# Patient Record
Sex: Female | Born: 1955 | Race: White | Hispanic: No | Marital: Married | Smoking: Never smoker
Health system: Southern US, Community
[De-identification: ages and names within clinical notes are randomized; demographics above are authoritative.]

## PROBLEM LIST (undated history)

## (undated) DIAGNOSIS — K219 Gastro-esophageal reflux disease without esophagitis: Secondary | ICD-10-CM

---

## 1997-12-29 ENCOUNTER — Emergency Department (HOSPITAL_COMMUNITY): Admission: EM | Admit: 1997-12-29 | Discharge: 1997-12-29 | Payer: Self-pay | Admitting: Emergency Medicine

## 1999-10-27 ENCOUNTER — Other Ambulatory Visit: Admission: RE | Admit: 1999-10-27 | Discharge: 1999-10-27 | Payer: Self-pay | Admitting: Gynecology

## 1999-10-29 ENCOUNTER — Encounter: Payer: Self-pay | Admitting: Gynecology

## 1999-10-29 ENCOUNTER — Encounter: Admission: RE | Admit: 1999-10-29 | Discharge: 1999-10-29 | Payer: Self-pay | Admitting: Gynecology

## 2000-11-11 ENCOUNTER — Other Ambulatory Visit: Admission: RE | Admit: 2000-11-11 | Discharge: 2000-11-11 | Payer: Self-pay | Admitting: Gynecology

## 2000-11-16 ENCOUNTER — Encounter: Admission: RE | Admit: 2000-11-16 | Discharge: 2000-11-16 | Payer: Self-pay | Admitting: Gynecology

## 2000-11-16 ENCOUNTER — Encounter: Payer: Self-pay | Admitting: Gynecology

## 2002-01-02 ENCOUNTER — Other Ambulatory Visit: Admission: RE | Admit: 2002-01-02 | Discharge: 2002-01-02 | Payer: Self-pay | Admitting: Gynecology

## 2002-01-09 ENCOUNTER — Encounter: Admission: RE | Admit: 2002-01-09 | Discharge: 2002-01-09 | Payer: Self-pay | Admitting: Gynecology

## 2002-01-09 ENCOUNTER — Encounter: Payer: Self-pay | Admitting: Gynecology

## 2002-01-13 ENCOUNTER — Encounter: Admission: RE | Admit: 2002-01-13 | Discharge: 2002-01-13 | Payer: Self-pay | Admitting: Gynecology

## 2002-01-13 ENCOUNTER — Encounter: Payer: Self-pay | Admitting: Gynecology

## 2003-01-24 ENCOUNTER — Encounter: Admission: RE | Admit: 2003-01-24 | Discharge: 2003-01-24 | Payer: Self-pay | Admitting: Gynecology

## 2003-01-24 ENCOUNTER — Encounter: Payer: Self-pay | Admitting: Gynecology

## 2003-01-31 ENCOUNTER — Encounter: Admission: RE | Admit: 2003-01-31 | Discharge: 2003-01-31 | Payer: Self-pay | Admitting: Gynecology

## 2003-01-31 ENCOUNTER — Encounter: Payer: Self-pay | Admitting: Gynecology

## 2003-04-23 ENCOUNTER — Encounter: Admission: RE | Admit: 2003-04-23 | Discharge: 2003-04-23 | Payer: Self-pay | Admitting: Gynecology

## 2003-05-21 ENCOUNTER — Encounter (INDEPENDENT_AMBULATORY_CARE_PROVIDER_SITE_OTHER): Payer: Self-pay | Admitting: *Deleted

## 2003-05-21 ENCOUNTER — Ambulatory Visit (HOSPITAL_BASED_OUTPATIENT_CLINIC_OR_DEPARTMENT_OTHER): Admission: RE | Admit: 2003-05-21 | Discharge: 2003-05-21 | Payer: Self-pay | Admitting: Surgery

## 2003-05-21 ENCOUNTER — Ambulatory Visit (HOSPITAL_COMMUNITY): Admission: RE | Admit: 2003-05-21 | Discharge: 2003-05-21 | Payer: Self-pay | Admitting: Surgery

## 2003-07-03 ENCOUNTER — Other Ambulatory Visit: Admission: RE | Admit: 2003-07-03 | Discharge: 2003-07-03 | Payer: Self-pay | Admitting: Gynecology

## 2004-07-17 ENCOUNTER — Other Ambulatory Visit: Admission: RE | Admit: 2004-07-17 | Discharge: 2004-07-17 | Payer: Self-pay | Admitting: Gynecology

## 2004-12-30 ENCOUNTER — Other Ambulatory Visit: Admission: RE | Admit: 2004-12-30 | Discharge: 2004-12-30 | Payer: Self-pay | Admitting: Gynecology

## 2005-07-21 ENCOUNTER — Ambulatory Visit (HOSPITAL_COMMUNITY): Admission: RE | Admit: 2005-07-21 | Discharge: 2005-07-21 | Payer: Self-pay | Admitting: Gynecology

## 2005-07-29 ENCOUNTER — Other Ambulatory Visit: Admission: RE | Admit: 2005-07-29 | Discharge: 2005-07-29 | Payer: Self-pay | Admitting: Gynecology

## 2005-08-13 ENCOUNTER — Encounter: Admission: RE | Admit: 2005-08-13 | Discharge: 2005-08-13 | Payer: Self-pay | Admitting: Gynecology

## 2006-05-20 ENCOUNTER — Other Ambulatory Visit: Admission: RE | Admit: 2006-05-20 | Discharge: 2006-05-20 | Payer: Self-pay | Admitting: Gynecology

## 2006-08-27 ENCOUNTER — Encounter: Admission: RE | Admit: 2006-08-27 | Discharge: 2006-08-27 | Payer: Self-pay | Admitting: Gynecology

## 2006-09-01 ENCOUNTER — Encounter: Admission: RE | Admit: 2006-09-01 | Discharge: 2006-09-01 | Payer: Self-pay | Admitting: Gynecology

## 2007-06-29 ENCOUNTER — Encounter: Admission: RE | Admit: 2007-06-29 | Discharge: 2007-06-29 | Payer: Self-pay | Admitting: Gynecology

## 2007-09-06 ENCOUNTER — Encounter: Admission: RE | Admit: 2007-09-06 | Discharge: 2007-09-06 | Payer: Self-pay | Admitting: Gynecology

## 2008-09-06 ENCOUNTER — Encounter: Admission: RE | Admit: 2008-09-06 | Discharge: 2008-09-06 | Payer: Self-pay | Admitting: Gynecology

## 2009-09-10 ENCOUNTER — Encounter: Admission: RE | Admit: 2009-09-10 | Discharge: 2009-09-10 | Payer: Self-pay | Admitting: Gynecology

## 2010-05-11 ENCOUNTER — Encounter: Payer: Self-pay | Admitting: Gynecology

## 2010-05-26 ENCOUNTER — Other Ambulatory Visit: Payer: Self-pay | Admitting: Gynecology

## 2010-09-05 NOTE — Op Note (Signed)
NAMEJALIZA, Jamie Graves                          ACCOUNT NO.:  [id number]   MEDICAL RECORD NO.:  [id number]                   PATIENT TYPE:  AMB   LOCATION:  DSC                                  FACILITY:  [facility]   PHYSICIAN:  Currie Paris, M.D.           DATE OF BIRTH:  [birth year]   DATE OF PROCEDURE:  05/21/2003  DATE OF DISCHARGE:                                 OPERATIVE REPORT   PREOPERATIVE DIAGNOSIS:  Left breast mass.   POSTOPERATIVE:  Left breast mass.   OPERATION:  Excisional biopsy of left breast mass.   SURGEON:  Currie Paris, M.D.   ANESTHESIA:  MAC.   CLINICAL HISTORY:  This patient has presented with a small area of density,  irregularity, and firmness in the upper inner quadrant of the left breast.  It was about 1 x 3 cm and was not seen on the mammography and ultrasound.  However, it was clearly different to palpation than any other area, and we  elected to do a biopsy.   PROCEDURE:  The patient was seen in the holding area and had already marked  the left side as the operative side, and I confirmed that with a mark.  She  was taken to the operating room and prior to be given IV sedation, the mass  itself was palpated, identified, and marked.  She was then given IV  sedation.  The breast was prepped and draped.  1% Xylocaine was used for  local.  I made an incision directly over the area, divided a little  subcutaneous tissue, and identified breast tissue and put a suture through  it for traction.  I then excised this area with cautery, and it appeared to  be basically fibrotic breast tissue with a little fat tissue mixed in.   Once this was excised, to make sure everything was dry, I used the cautery.  The incision was closed with 3-0 Vicryl, followed by 4-0 Monocryl  subcuticular and Dermabond.  The patient tolerated the procedure well.  There were no operative complications.  All counts were correct.       Currie Paris, M.D.    CJS/MEDQ  D:  05/21/2003  T:  05/21/2003  Job:  [id number]   cc:   Gretta Cool, M.D.  [address] [place]  [place] [address]  Fax: [phone]

## 2010-09-26 ENCOUNTER — Other Ambulatory Visit: Payer: Self-pay | Admitting: Gynecology

## 2010-09-26 DIAGNOSIS — Z1231 Encounter for screening mammogram for malignant neoplasm of breast: Secondary | ICD-10-CM

## 2010-10-03 ENCOUNTER — Ambulatory Visit: Payer: Self-pay

## 2010-10-20 ENCOUNTER — Ambulatory Visit
Admission: RE | Admit: 2010-10-20 | Discharge: 2010-10-20 | Disposition: A | Discharge status: Home / Self Care | Payer: BC Managed Care – PPO | Source: Ambulatory Visit | Attending: Gynecology | Admitting: Gynecology

## 2010-10-20 DIAGNOSIS — Z1231 Encounter for screening mammogram for malignant neoplasm of breast: Secondary | ICD-10-CM

## 2011-05-12 ENCOUNTER — Other Ambulatory Visit: Payer: Self-pay | Admitting: Gynecology

## 2012-08-29 ENCOUNTER — Other Ambulatory Visit: Payer: Self-pay

## 2012-08-29 DIAGNOSIS — Z1231 Encounter for screening mammogram for malignant neoplasm of breast: Secondary | ICD-10-CM

## 2012-09-28 ENCOUNTER — Ambulatory Visit
Admission: RE | Admit: 2012-09-28 | Discharge: 2012-09-28 | Disposition: A | Discharge status: Home / Self Care | Payer: BC Managed Care – PPO | Source: Ambulatory Visit

## 2012-09-28 DIAGNOSIS — Z1231 Encounter for screening mammogram for malignant neoplasm of breast: Secondary | ICD-10-CM

## 2012-12-08 ENCOUNTER — Emergency Department (HOSPITAL_BASED_OUTPATIENT_CLINIC_OR_DEPARTMENT_OTHER)
Admission: EM | Admit: 2012-12-08 | Discharge: 2012-12-08 | Disposition: A | Discharge status: Home / Self Care | Payer: BC Managed Care – PPO | Attending: Emergency Medicine | Admitting: Emergency Medicine

## 2012-12-08 ENCOUNTER — Emergency Department (HOSPITAL_BASED_OUTPATIENT_CLINIC_OR_DEPARTMENT_OTHER): Payer: BC Managed Care – PPO

## 2012-12-08 ENCOUNTER — Encounter (HOSPITAL_BASED_OUTPATIENT_CLINIC_OR_DEPARTMENT_OTHER): Payer: Self-pay | Admitting: *Deleted

## 2012-12-08 DIAGNOSIS — K219 Gastro-esophageal reflux disease without esophagitis: Secondary | ICD-10-CM | POA: Insufficient documentation

## 2012-12-08 DIAGNOSIS — Z79899 Other long term (current) drug therapy: Secondary | ICD-10-CM | POA: Insufficient documentation

## 2012-12-08 DIAGNOSIS — R109 Unspecified abdominal pain: Secondary | ICD-10-CM | POA: Insufficient documentation

## 2012-12-08 DIAGNOSIS — R110 Nausea: Secondary | ICD-10-CM | POA: Insufficient documentation

## 2012-12-08 DIAGNOSIS — R0789 Other chest pain: Secondary | ICD-10-CM | POA: Insufficient documentation

## 2012-12-08 HISTORY — DX: Gastro-esophageal reflux disease without esophagitis: K21.9

## 2012-12-08 LAB — CBC WITH DIFFERENTIAL/PLATELET
Eosinophils Absolute: 0.1 10*3/uL (ref 0.0–0.7)
Eosinophils Relative: 3 % (ref 0–5)
HCT: 37.9 % (ref 36.0–46.0)
Lymphs Abs: 1.8 10*3/uL (ref 0.7–4.0)
MCHC: 33 g/dL (ref 30.0–36.0)
MCV: 97.4 fL (ref 78.0–100.0)
Neutro Abs: 2.3 10*3/uL (ref 1.7–7.7)
Neutrophils Relative %: 49 % (ref 43–77)
Platelets: 229 10*3/uL (ref 150–400)
RBC: 3.89 MIL/uL (ref 3.87–5.11)

## 2012-12-08 LAB — TROPONIN I
Troponin I: <0.3 ng/mL (ref <0.30)
Troponin I: <0.3 ng/mL (ref <0.30)

## 2012-12-08 LAB — COMPREHENSIVE METABOLIC PANEL
Calcium: 9.9 mg/dL (ref 8.4–10.5)
Chloride: 103 mEq/L (ref 96–112)
GFR calc non Af Amer: >90 mL/min (ref >90)

## 2012-12-08 MED ORDER — GI COCKTAIL ~~LOC~~
30.0000 mL | Freq: Once | ORAL | Status: AC
  Administered 2012-12-08: 30 mL via ORAL
  Filled 2012-12-08: qty 30

## 2012-12-08 MED ORDER — PANTOPRAZOLE SODIUM 40 MG IV SOLR
40.0000 mg | Freq: Once | INTRAVENOUS | Status: AC
  Administered 2012-12-08: 40 mg via INTRAVENOUS
  Filled 2012-12-08: qty 40

## 2012-12-08 MED ORDER — OMEPRAZOLE 20 MG PO CPDR: 20.0000 mg | DELAYED_RELEASE_CAPSULE | Freq: Every day | ORAL | Status: DC

## 2012-12-08 NOTE — ED Provider Notes (Signed)
CSN: [id number]     Arrival date & time 12/08/12  7829 History     First MD Initiated Contact with Patient 12/08/12 1005     Chief Complaint  Patient presents with  . Chest Pain   (Consider location/radiation/quality/duration/timing/severity/associated sxs/prior Treatment) HPI Comments: Patient presents with 3 day history of intermittent epigastric pain it radiates to her back. The pain comes and goes as a burning, sharp and stabbing pain lasting for 1 minute at a time. This comes and goes every few minutes to hours. It is somewhat worse with food and somewhat better with laying down. Is associated with some shortness of breath and nausea. No diaphoresis. Cardiac history. Never had a stress test. Has a history of acid reflux in the used to be on Nexium. Has not been on meds for 5 years as it seemed to go when she changed her diet. This pain is more severe than what she had in the past. She continues to drink 2 glasses of wine daily. She does not smoke. She's never had an upper endoscopy or stress test.  The history is provided by the patient.    Past Medical History  Diagnosis Date  . GERD (gastroesophageal reflux disease)     no treatment in 5 years.   History reviewed. No pertinent past surgical history. No family history on file. History  Substance Use Topics  . Smoking status: Never Smoker   . Smokeless tobacco: Never Used  . Alcohol Use: 1.2 oz/week    2 Glasses of wine per week     Comment: daily   OB History   Grav Para Term Preterm Abortions TAB SAB Ect Mult Living                 Review of Systems  Constitutional: Negative for fever, activity change and appetite change.  HENT: Negative for rhinorrhea.   Respiratory: Negative for cough, chest tightness and shortness of breath.   Cardiovascular: Positive for chest pain.  Gastrointestinal: Positive for nausea and abdominal pain. Negative for vomiting.  Genitourinary: Negative for dysuria, vaginal bleeding and vaginal  discharge.  Musculoskeletal: Negative for back pain.  Skin: Negative for rash.  Neurological: Negative for dizziness, weakness and headaches.  A complete 10 system review of systems was obtained and all systems are negative except as noted in the HPI and PMH.    Allergies  Review of patient's allergies indicates no known allergies.  Home Medications   Current Outpatient Rx  Name  Route  Sig  Dispense  Refill  . omeprazole (PRILOSEC) 20 MG capsule   Oral   Take 1 capsule (20 mg total) by mouth daily.   30 capsule   0    BP 116/74  Temp(Src) 98.6 F (37 C) (Oral)  Resp 20  Ht 5\' 6"  (1.676 m)  Wt 190 lb (86.183 kg)  BMI 30.68 kg/m2  SpO2 100% Physical Exam  Constitutional: She is oriented to person, place, and time. She appears well-developed and well-nourished. No distress.  HENT:  Head: Normocephalic and atraumatic.  Mouth/Throat: Oropharynx is clear and moist. No oropharyngeal exudate.  Eyes: Conjunctivae and EOM are normal. Pupils are equal, round, and reactive to light.  Neck: Neck supple.  Cardiovascular: Normal rate, regular rhythm and normal heart sounds.   No murmur heard. Pulmonary/Chest: Effort normal and breath sounds normal. No respiratory distress.  Abdominal: Soft. There is tenderness. There is no rebound and no guarding.  Epigastric tenderness. No guarding or rebound. Negative Murphy's sign  Musculoskeletal: Normal range of motion. She exhibits no edema and no tenderness.  Neurological: She is alert and oriented to person, place, and time. No cranial nerve deficit. She exhibits normal muscle tone. Coordination normal.  Skin: Skin is warm.    ED Course   Procedures (including critical care time)  Labs Reviewed  COMPREHENSIVE METABOLIC PANEL - Abnormal; Notable for the following:    Glucose, Bld 104 (*)    All other components within normal limits  CBC WITH DIFFERENTIAL  LIPASE, BLOOD  TROPONIN I  TROPONIN I   Dg Abd Acute W/chest  12/08/2012    *RADIOLOGY REPORT*  Clinical Data: Abdominal pain and nausea  ACUTE ABDOMEN SERIES (ABDOMEN 2 VIEW & CHEST 1 VIEW)  Comparison: None.  Findings:  PA chest:  Lungs clear.  Heart size and pulmonary vascularity are normal.  No adenopathy.  Supine and upright abdomen:  There is moderate stool the colon. The bowel gas pattern is normal.  No obstruction or free air. Pelvic calcifications appear consistent with phleboliths.  IMPRESSION: Unremarkable bowel gas pattern.  No edema or consolidation.   Original Report Authenticated By: Bretta Bang, M.D.   1. Atypical chest pain     MDM  3 day History of epigastric pain that radiates to the back lasting for a minute or 2 at a time. Worse with food and better with lying down.  EKG nonischemic. LFTs normal. Lipase normal. Troponin negative.  Suspect related to GI origin. Doubt ACS. No right upper quadrant pain. Pain improved with GI cocktail.  Delta troponin negative.  Pain improved.  Patient instructed to followup for outpatient stress test. Return precautions discussed. Will restart PPI.   Date: 12/08/2012  Rate: 78  Rhythm: normal sinus rhythm  QRS Axis: normal  Intervals: normal  ST/T Wave abnormalities: normal  Conduction Disutrbances:none  Narrative Interpretation:   Old EKG Reviewed: none available    Glynn Octave, MD 12/08/12 1417

## 2012-12-08 NOTE — ED Notes (Signed)
Patient states she has a three day history of eipgastric chest pain.  States the pain is intermittent and radiates to her back.  Describes the pain as a burning pain and is sharpe, associated with nausea and shortness of breath. Hx GERD.

## 2012-12-08 NOTE — ED Notes (Signed)
Pt refuses second troponin blood draw. Pt states she has to be back at her office at 1:00. Dr. Manus Gunning informed.

## 2012-12-08 NOTE — ED Notes (Signed)
Pt now agrees to allow second troponin draw. States that she will leave at 12:45 if it is not resulted.

## 2013-11-01 ENCOUNTER — Other Ambulatory Visit: Payer: Self-pay

## 2013-11-01 DIAGNOSIS — Z1231 Encounter for screening mammogram for malignant neoplasm of breast: Secondary | ICD-10-CM

## 2013-12-14 ENCOUNTER — Ambulatory Visit: Payer: BC Managed Care – PPO

## 2013-12-20 ENCOUNTER — Ambulatory Visit
Admission: RE | Admit: 2013-12-20 | Discharge: 2013-12-20 | Disposition: A | Discharge status: Home / Self Care | Payer: BC Managed Care – PPO | Source: Ambulatory Visit

## 2013-12-20 DIAGNOSIS — Z1231 Encounter for screening mammogram for malignant neoplasm of breast: Secondary | ICD-10-CM

## 2014-05-28 ENCOUNTER — Ambulatory Visit (INDEPENDENT_AMBULATORY_CARE_PROVIDER_SITE_OTHER): Payer: BLUE CROSS/BLUE SHIELD | Admitting: Family

## 2014-05-28 ENCOUNTER — Encounter: Payer: Self-pay | Admitting: Family

## 2014-05-28 VITALS — BP 110/70 | HR 70 | Temp 98.4°F | Resp 16 | Ht 65.0 in | Wt 209.2 lb

## 2014-05-28 DIAGNOSIS — R635 Abnormal weight gain: Secondary | ICD-10-CM

## 2014-05-28 DIAGNOSIS — E669 Obesity, unspecified: Secondary | ICD-10-CM

## 2014-05-28 LAB — TSH: TSH: 1.26 u[IU]/mL (ref 0.35–4.50)

## 2014-05-28 NOTE — Progress Notes (Signed)
Subjective:    Patient ID: Jamie Graves, female    DOB: Sep 01, 1955, 59 y.o.   MRN: [id number]  HPI  Patient here today to establish care.  She reports that she was followed by her GYN.  She reports primary concern of weight gain.  She reports steady weight gain over the last 3 years. 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 Notes that she does not eat fried food. Never counted calories.    Breakfast (egg beaters and blueberries) coffee Lunch- salad or sandwich (no processed meat) occasionally soup Dinner-  Reports that she eats chicken/fish [place], brussel sprouts, quinoa  Snacks on crackers has a glass of wine each night.  Exercise- she reports that she used to walk on a treadmill and lost 1 pound over 3 months.    Wt Readings from Last 3 Encounters:  05/28/14 209 lb 3.2 oz (94.892 kg)  12/08/12 190 lb (86.183 kg)     Past Medical History  Diagnosis Date  . GERD (gastroesophageal reflux disease)     no treatment in 5 years.    Review of Systems  Constitutional: Positive for unexpected weight change.  HENT: Negative for rhinorrhea.   Respiratory: Negative for cough.   Cardiovascular: Negative for leg swelling.  Gastrointestinal: Negative for nausea, vomiting and diarrhea.  Genitourinary: Negative for dysuria and frequency.  Musculoskeletal: Negative for myalgias and arthralgias.  Skin: Negative for rash.  Neurological: Negative for headaches.  Hematological: Negative for adenopathy.  Psychiatric/Behavioral:       Denies depression/anxiety   Past Medical History  Diagnosis Date  . GERD (gastroesophageal reflux disease)     no treatment in 5 years.    History   Social History  . Marital Status: Married    Spouse Name: N/A    Number of Children: N/A  . Years of Education: N/A   Occupational History  . Not on file.   Social History Main Topics  . Smoking status: Never Smoker   . Smokeless tobacco: Never Used  . Alcohol Use: 3.0 oz/week      5 Glasses of wine per week  . Drug Use: No  . Sexual Activity: Not on file   Other Topics Concern  . Not on file   Social History Narrative   Owns a Marketing executive studio   2 children   1982- Son Johna Sheriff- lives in [place] (he has a son and step daughter)   1984-daughter christy- lives locally (3 children)   [place] property [place].   Some college   Second marriage- 19 years.        History reviewed. No pertinent past surgical history.  Family History  Problem Relation Age of Onset  . Stroke Mother     "mini strokes"  . Heart disease Father   . Cancer Paternal Grandmother     lung  . Diabetes Neg Hx   . Hypertension Neg Hx     No Known Allergies  No current outpatient prescriptions on file prior to visit.   No current facility-administered medications on file prior to visit.    BP 110/70 mmHg  Pulse 70  Temp(Src) 98.4 F (36.9 C) (Oral)  Resp 16  Ht  (1.651 m)  Wt 209 lb 3.2 oz (94.892 kg)  BMI 34.81 kg/m2  SpO2 98%       Objective:    Physical Exam  Constitutional: She is oriented to person, place, and  time. She appears well-developed and well-nourished.  HENT:  Head: Normocephalic and atraumatic.  Cardiovascular: Normal rate, regular rhythm and normal heart sounds.   No murmur heard. Pulmonary/Chest: Effort normal and breath sounds normal. No respiratory distress. She has no wheezes.  Abdominal: Soft. Bowel sounds are normal. She exhibits no distension. There is no tenderness.  Lymphadenopathy:    She has no cervical adenopathy.  Neurological: She is alert and oriented to person, place, and time.  Skin: Skin is warm and dry.  Psychiatric: She has a normal mood and affect. Her behavior is normal. Judgment and thought content normal.    BP 110/70 mmHg  Pulse 70  Temp(Src) 98.4 F (36.9 C) (Oral)  Resp 16  Ht 5\' 5"  (1.651 m)  Wt 209 lb 3.2 oz (94.892 kg)  BMI 34.81 kg/m2  SpO2 98% Wt  Readings from Last 3 Encounters:  05/28/14 209 lb 3.2 oz (94.892 kg)  12/08/12 190 lb (86.183 kg)     Lab Results  Component Value Date   WBC 4.8 12/08/2012   HGB 12.5 12/08/2012   HCT 37.9 12/08/2012   PLT 229 12/08/2012   GLUCOSE 104* 12/08/2012   ALT 15 12/08/2012   AST 21 12/08/2012   NA 139 12/08/2012   K 4.8 12/08/2012   CL 103 12/08/2012   CREATININE 0.70 12/08/2012   BUN 22 12/08/2012   CO2 28 12/08/2012    Mm Digital Screening Bilateral  12/21/2013   CLINICAL DATA:  Screening.  EXAM: DIGITAL SCREENING BILATERAL MAMMOGRAM WITH CAD  COMPARISON:  Previous exam(s).  ACR Breast Density Category c: The breast tissue is heterogeneously dense, which may obscure small masses.  FINDINGS: There are no findings suspicious for malignancy. Images were processed with CAD.  IMPRESSION: No mammographic evidence of malignancy. A result letter of this screening mammogram will be mailed directly to the patient.  RECOMMENDATION: Screening mammogram in one year. (Code:SM-B-01Y)  BI-RADS CATEGORY  1: Negative.   Electronically Signed   By: Christiana PellantGretchen  Green M.D.   On: 12/21/2013 09:38       Assessment & Plan:   Problem List Items Addressed This Visit    None       Lemont Fillers'SULLIVAN,Alyssandra Hulsebus S., NP

## 2014-05-28 NOTE — Patient Instructions (Signed)
Please complete lab work prior to leaving. Schedule a complete physical at your convenience. Welcome to [facility]!

## 2014-05-28 NOTE — Progress Notes (Signed)
Pre visit review using our clinic review tool, if applicable. No additional management support is needed unless otherwise documented below in the visit note. 

## 2014-05-28 NOTE — Assessment & Plan Note (Signed)
30 minutes spent with pt today. >50% of this time was spent counseling pt on diet/exercise/weight loss. We discussed adding 30 minutes 5 days a week of cardio and counting calories using my fitness pal.

## 2014-05-29 ENCOUNTER — Encounter: Payer: Self-pay | Admitting: Family

## 2014-07-11 ENCOUNTER — Telehealth: Payer: Self-pay | Admitting: Family

## 2014-07-11 NOTE — Telephone Encounter (Signed)
pre visit letter sent °

## 2014-07-23 ENCOUNTER — Ambulatory Visit: Payer: BLUE CROSS/BLUE SHIELD | Admitting: Podiatry

## 2014-07-27 ENCOUNTER — Telehealth: Payer: Self-pay | Admitting: *Deleted

## 2014-07-27 ENCOUNTER — Encounter: Payer: BLUE CROSS/BLUE SHIELD | Admitting: Family

## 2014-07-27 NOTE — Telephone Encounter (Signed)
Unable to reach patient at time of Pre-Visit Call.  Left message for patient to return call when available.    

## 2014-07-30 ENCOUNTER — Encounter: Payer: Self-pay | Admitting: Family

## 2014-07-30 ENCOUNTER — Ambulatory Visit (INDEPENDENT_AMBULATORY_CARE_PROVIDER_SITE_OTHER): Payer: BLUE CROSS/BLUE SHIELD | Admitting: Family

## 2014-07-30 VITALS — BP 120/86 | HR 80 | Temp 98.0°F | Resp 16 | Ht 65.0 in | Wt 196.8 lb

## 2014-07-30 DIAGNOSIS — E2839 Other primary ovarian failure: Secondary | ICD-10-CM

## 2014-07-30 DIAGNOSIS — Z23 Encounter for immunization: Secondary | ICD-10-CM | POA: Diagnosis not present

## 2014-07-30 DIAGNOSIS — Z0000 Encounter for general adult medical examination without abnormal findings: Secondary | ICD-10-CM

## 2014-07-30 LAB — LIPID PANEL
CHOL/HDL RATIO: 4
Cholesterol: 212 mg/dL — ABNORMAL HIGH (ref 0–200)
HDL: 53 mg/dL (ref >39.00)
LDL CALC: 143 mg/dL — AB (ref 0–99)
NonHDL: 159
Triglycerides: 79 mg/dL (ref 0.0–149.0)
VLDL: 15.8 mg/dL (ref 0.0–40.0)

## 2014-07-30 LAB — CBC WITH DIFFERENTIAL/PLATELET
BASOS PCT: 0.5 % (ref 0.0–3.0)
Basophils Absolute: 0 10*3/uL (ref 0.0–0.1)
EOS ABS: 0.1 10*3/uL (ref 0.0–0.7)
Eosinophils Relative: 1.8 % (ref 0.0–5.0)
HEMATOCRIT: 38.1 % (ref 36.0–46.0)
HEMOGLOBIN: 12.8 g/dL (ref 12.0–15.0)
Lymphocytes Relative: 40.5 % (ref 12.0–46.0)
Lymphs Abs: 2 10*3/uL (ref 0.7–4.0)
MCHC: 33.6 g/dL (ref 30.0–36.0)
MCV: 92.6 fl (ref 78.0–100.0)
Monocytes Absolute: 0.4 10*3/uL (ref 0.1–1.0)
Monocytes Relative: 7.9 % (ref 3.0–12.0)
NEUTROS PCT: 49.3 % (ref 43.0–77.0)
Neutro Abs: 2.4 10*3/uL (ref 1.4–7.7)
PLATELETS: 225 10*3/uL (ref 150.0–400.0)
RBC: 4.11 Mil/uL (ref 3.87–5.11)
RDW: 13.8 % (ref 11.5–15.5)
WBC: 4.9 10*3/uL (ref 4.0–10.5)

## 2014-07-30 LAB — BASIC METABOLIC PANEL
BUN: 16 mg/dL (ref 6–23)
CALCIUM: 9.6 mg/dL (ref 8.4–10.5)
CO2: 30 meq/L (ref 19–32)
Chloride: 102 mEq/L (ref 96–112)
Creatinine, Ser: 0.62 mg/dL (ref 0.40–1.20)
GFR: 104.79 mL/min (ref >60.00)
GLUCOSE: 95 mg/dL (ref 70–99)
Potassium: 3.8 mEq/L (ref 3.5–5.1)
Sodium: 137 mEq/L (ref 135–145)

## 2014-07-30 LAB — URINALYSIS, ROUTINE W REFLEX MICROSCOPIC
Bilirubin Urine: NEGATIVE
KETONES UR: NEGATIVE
NITRITE: NEGATIVE
Specific Gravity, Urine: 1.01 (ref 1.000–1.030)
Total Protein, Urine: NEGATIVE
URINE GLUCOSE: NEGATIVE
Urobilinogen, UA: 0.2 (ref 0.0–1.0)
pH: 7.5 (ref 5.0–8.0)

## 2014-07-30 LAB — HEPATIC FUNCTION PANEL
ALBUMIN: 4.3 g/dL (ref 3.5–5.2)
ALK PHOS: 68 U/L (ref 39–117)
ALT: 15 U/L (ref 0–35)
AST: 16 U/L (ref 0–37)
BILIRUBIN TOTAL: 0.6 mg/dL (ref 0.2–1.2)
Bilirubin, Direct: 0.1 mg/dL (ref 0.0–0.3)
TOTAL PROTEIN: 7.3 g/dL (ref 6.0–8.3)

## 2014-07-30 NOTE — Assessment & Plan Note (Signed)
Commended pt on weight loss. Continue healthy diet, exercise, weight loss efforts. Immunizations up to date, refer for dexa, mammogram up to date.

## 2014-07-30 NOTE — Progress Notes (Signed)
Subjective:    Patient ID: Jamie Graves, female    DOB: April 08, 1956, 59 y.o.   MRN: [id number]  HPI  Patient presents today for complete physical.  Immunizations: due for tdap Diet: diet is improved, has lost 13 pounds since february Exercise: walks 1 mile a week. Colonoscopy:  2007, no polyps, due 2017 Dexa: >3 yrs ago Pap Smear: 02/2014 Mammogram: 9/15    Review of Systems  Constitutional: Negative for unexpected weight change.  HENT: Negative for hearing loss and rhinorrhea.   Eyes: Negative for visual disturbance.  Respiratory: Negative for cough.   Cardiovascular: Negative for leg swelling.  Gastrointestinal: Negative for nausea, diarrhea and constipation.  Genitourinary: Negative for dysuria and frequency.       Menopause age 14  Musculoskeletal: Negative for myalgias and arthralgias.  Skin: Negative for rash.  Neurological: Negative for headaches.  Hematological: Negative for adenopathy.  Psychiatric/Behavioral: Negative for dysphoric mood and agitation.   Past Medical History  Diagnosis Date  . GERD (gastroesophageal reflux disease)     no treatment in 5 years.    History   Social History  . Marital Status: Married    Spouse Name: N/A  . Number of Children: N/A  . Years of Education: N/A   Occupational History  . Not on file.   Social History Main Topics  . Smoking status: Never Smoker   . Smokeless tobacco: Never Used  . Alcohol Use: 3.0 oz/week    5 Glasses of wine per week  . Drug Use: No  . Sexual Activity: Not on file   Other Topics Concern  . Not on file   Social History Narrative   Owns a Marketing executive studio   2 children   1982- Son Johna Sheriff- lives in [place] (he has a son and step daughter)   1984-daughter christy- lives locally (3 children)   [place] and [place] property at [place].   Some college   Second marriage- 19 years.        History reviewed. No pertinent past surgical  history.  Family History  Problem Relation Age of Onset  . Stroke Mother     "mini strokes"  . Heart disease Father   . Cancer Paternal Grandmother     lung  . Diabetes Neg Hx   . Hypertension Neg Hx     No Known Allergies  No current outpatient prescriptions on file prior to visit.   No current facility-administered medications on file prior to visit.    BP 120/86 mmHg  Pulse 80  Temp(Src) 98 F (36.7 C) (Oral)  Resp 16  Ht  (1.651 m)  Wt 196 lb 12.8 oz (89.268 kg)  BMI 32.75 kg/m2  SpO2 99%       Objective:   Physical Exam  Physical Exam  Constitutional: She is oriented to person, place, and time. She appears well-developed and well-nourished. No distress.  HENT:  Head: Normocephalic and atraumatic.  Right Ear: Tympanic membrane and ear canal normal.  Left Ear: Tympanic membrane and ear canal normal.  Mouth/Throat: Oropharynx is clear and moist.  Eyes: Pupils are equal, round, and reactive to light. No scleral icterus.  Neck: Normal range of motion. No thyromegaly present.  Cardiovascular: Normal rate and regular rhythm.   No murmur heard. Pulmonary/Chest: Effort normal and breath sounds normal. No respiratory distress. He has no wheezes. She has no rales. She exhibits no tenderness.  Abdominal: Soft. Bowel sounds are normal. He  exhibits no distension and no mass. There is no tenderness. There is no rebound and no guarding.  Musculoskeletal: She exhibits no edema.  Lymphadenopathy:    She has no cervical adenopathy.  Neurological: She is alert and oriented to person, place, and time. She has normal patellar reflexes. She exhibits normal muscle tone. Coordination normal.  Skin: Skin is warm and dry.  Psychiatric: She has a normal mood and affect. Her behavior is normal. Judgment and thought content normal.  Breasts: Examined lying Right: Without masses, retractions, discharge or axillary adenopathy.  Left: Without masses, retractions, discharge or  axillary adenopathy.  Pelvic: deferred         Assessment & Plan:         Assessment & Plan:

## 2014-07-30 NOTE — Progress Notes (Signed)
Pre visit review using our clinic review tool, if applicable. No additional management support is needed unless otherwise documented below in the visit note. 

## 2014-07-30 NOTE — Patient Instructions (Addendum)
Please complete lab work prior to leaving. You will be contacted about your bone density.  Try to get 30 min of walking 5 days a week.   Follow up in 1 year. Sooner if problems/concerns.

## 2014-08-01 ENCOUNTER — Telehealth: Payer: Self-pay | Admitting: Family

## 2014-08-01 ENCOUNTER — Ambulatory Visit (INDEPENDENT_AMBULATORY_CARE_PROVIDER_SITE_OTHER): Payer: BLUE CROSS/BLUE SHIELD | Admitting: Podiatry

## 2014-08-01 ENCOUNTER — Ambulatory Visit (INDEPENDENT_AMBULATORY_CARE_PROVIDER_SITE_OTHER): Payer: BLUE CROSS/BLUE SHIELD

## 2014-08-01 ENCOUNTER — Ambulatory Visit: Payer: BLUE CROSS/BLUE SHIELD

## 2014-08-01 ENCOUNTER — Encounter: Payer: Self-pay | Admitting: Podiatry

## 2014-08-01 VITALS — BP 140/88 | HR 70 | Resp 15

## 2014-08-01 DIAGNOSIS — M79671 Pain in right foot: Secondary | ICD-10-CM

## 2014-08-01 DIAGNOSIS — M79672 Pain in left foot: Secondary | ICD-10-CM

## 2014-08-01 DIAGNOSIS — R3129 Other microscopic hematuria: Secondary | ICD-10-CM

## 2014-08-01 DIAGNOSIS — M2010 Hallux valgus (acquired), unspecified foot: Secondary | ICD-10-CM

## 2014-08-01 NOTE — Patient Instructions (Addendum)
Pre-Operative Instructions  Congratulations, you have decided to take an important step to improving your quality of life.  You can be assured that the doctors of [facility] will be with you every step of the way.  1. Plan to be at the surgery center/hospital at least 1 (one) hour prior to your scheduled time unless otherwise directed by the surgical center/hospital staff.  You must have a responsible adult accompany you, remain during the surgery and drive you home.  Make sure you have directions to the surgical center/hospital and know how to get there on time. 2. For hospital based surgery you will need to obtain a history and physical form from your family physician within 1 month prior to the date of surgery- we will give you a form for you primary physician.  3. We make every effort to accommodate the date you request for surgery.  There are however, times where surgery dates or times have to be moved.  We will contact you as soon as possible if a change in schedule is required.   4. No Aspirin/Ibuprofen for one week before surgery.  If you are on aspirin, any non-steroidal anti-inflammatory medications (Mobic, Aleve, Ibuprofen) you should stop taking it 7 days prior to your surgery.  You make take Tylenol  For pain prior to surgery.  5. Medications- If you are taking daily heart and blood pressure medications, seizure, reflux, allergy, asthma, anxiety, pain or diabetes medications, make sure the surgery center/hospital is aware before the day of surgery so they may notify you which medications to take or avoid the day of surgery. 6. No food or drink after midnight the night before surgery unless directed otherwise by surgical center/hospital staff. 7. No alcoholic beverages 24 hours prior to surgery.  No smoking 24 hours prior to or 24 hours after surgery. 8. Wear loose pants or shorts- loose enough to fit over bandages, boots, and casts. 9. No slip on shoes, sneakers are best. 10. Bring  your boot with you to the surgery center/hospital.  Also bring crutches or a walker if your physician has prescribed it for you.  If you do not have this equipment, it will be provided for you after surgery. 11. If you have not been contracted by the surgery center/hospital by the day before your surgery, call to confirm the date and time of your surgery. 12. Leave-time from work may vary depending on the type of surgery you have.  Appropriate arrangements should be made prior to surgery with your employer. 13. Prescriptions will be provided immediately following surgery by your doctor.  Have these filled as soon as possible after surgery and take the medication as directed. 14. Remove nail polish on the operative foot. 15. Wash the night before surgery.  The night before surgery wash the foot and leg well with the antibacterial soap provided and water paying special attention to beneath the toenails and in between the toes.  Rinse thoroughly with water and dry well with a towel.  Perform this wash unless told not to do so by your physician.  Enclosed: 1 Ice pack (please put in freezer the night before surgery)   1 Hibiclens skin cleaner   Pre-op Instructions  If you have any questions regarding the instructions, do not hesitate to call our office.  [place]: [address] [place], [place] [address] [phone]  [place]: [address] [place], [place] [address] [phone]  [place]: [address]  [place], [place] [address] [phone]  Dr. Richard   Tuchman DPM, Dr. Cristie HemNorman Regal DPM Dr. Alvan Dameichard Sikora DPM, Dr. Ardelle AntonM. Todd Hyatt DPM, Dr. Marlowe AschoffKathryn Egerton DPMBunion You have a bunion deformity of the feet. This is more common in women. It tends to be an inherited problem. Symptoms can include pain, swelling, and deformity around the great toe. Numbness and tingling may also be present. Your symptoms are often worsened by wearing shoes that cause pressure on the bunion. Changing the type of shoes you  wear helps reduce symptoms. A wide shoe decreases pressure on the bunion. An arch support may be used if you have flat feet. Avoid shoes with heels higher than two inches. This puts more pressure on the bunion. X-rays may be helpful in evaluating the severity of the problem. Other foot problems often seen with bunions include corns, calluses, and hammer toes. If the deformity or pain is severe, surgical treatment may be necessary. Keep off your painful foot as much as possible until the pain is relieved. Call your caregiver if your symptoms are worse.  SEEK IMMEDIATE MEDICAL CARE IF:  You have increased redness, pain, swelling, or other symptoms of infection. Document Released: 04/06/2005 Document Revised: 06/29/2011 Document Reviewed: 10/04/2006 [facility] Patient Information 2015 [employer] [place]. This information is not intended to replace advice given to you by your health care provider. Make sure you discuss any questions you have with your health care provider.

## 2014-08-01 NOTE — Progress Notes (Signed)
   Subjective:    Patient ID: Jamie Hahnynthia Graves, female    DOB: 1955-06-02, 59 y.o.   MRN: [id number]  HPI Pt presents with bilateral bunions, painful left bunion, making it hard to wear enclosed shoes   Review of Systems  All other systems reviewed and are negative.      Objective:   Physical Exam        Assessment & Plan:

## 2014-08-01 NOTE — Progress Notes (Signed)
Subjective:     Patient ID: Jamie Graves, female   DOB: [birth year], 59 y.o.   MRN: [id number]  HPI patient presents with painful structural bunion deformity left over right and digital contracture lesser digits left over right with pain along the metatarsal phalangeal joints left. States it's been present and getting worse for the last few years and she has tried different shoe gear soaks and anti-inflammatories   Review of Systems  All other systems reviewed and are negative.      Objective:   Physical Exam  Constitutional: She is oriented to person, place, and time.  Cardiovascular: Intact distal pulses.   Musculoskeletal: Normal range of motion.  Neurological: She is oriented to person, place, and time.  Skin: Skin is warm.  Nursing note and vitals reviewed.  neurovascular status intact with muscle strength adequate and range of motion subtalar midtarsal joint within normal limits. Patient's noted to have good digital perfusion is well oriented 3 in no equinus is noted. 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 Discussed at great length treatment options conservative and surgical and due to long-term nature failure to respond to previous conservative care it's been recommended to surgical intervention. This will require Austin-type osteotomy possible Akin osteotomy and digital fusion of digits 234 on the left foot. Educated her on this and she will reappoint for consult in  2 weeks and is scheduled for surgery in 3-4 weeks

## 2014-08-01 NOTE — Telephone Encounter (Addendum)
Microscopic blood in urine. Repeat UA in 1 month, dx is microscopic hematuria. Cholesterol is mildly elevated. Please work on low fat/low cholesterol diet and exercise.

## 2014-08-03 NOTE — Telephone Encounter (Signed)
Notified pt and she will return for u/a on 08/30/14 at 8:30am. Pt reports that she had blood in her urine at her pap smear in 02/2014 and repeated test and was told it was ok.

## 2014-08-15 ENCOUNTER — Ambulatory Visit: Payer: BLUE CROSS/BLUE SHIELD | Admitting: Podiatry

## 2014-08-27 ENCOUNTER — Other Ambulatory Visit: Payer: BLUE CROSS/BLUE SHIELD

## 2014-08-30 ENCOUNTER — Other Ambulatory Visit (INDEPENDENT_AMBULATORY_CARE_PROVIDER_SITE_OTHER): Payer: BLUE CROSS/BLUE SHIELD

## 2014-08-30 DIAGNOSIS — R312 Other microscopic hematuria: Secondary | ICD-10-CM | POA: Diagnosis not present

## 2014-08-30 DIAGNOSIS — R3129 Other microscopic hematuria: Secondary | ICD-10-CM

## 2014-08-31 ENCOUNTER — Encounter: Payer: Self-pay | Admitting: Family

## 2014-08-31 LAB — URINALYSIS, ROUTINE W REFLEX MICROSCOPIC
Bilirubin Urine: NEGATIVE
Glucose, UA: NEGATIVE mg/dL
Hgb urine dipstick: NEGATIVE
KETONES UR: NEGATIVE mg/dL
Leukocytes, UA: NEGATIVE
NITRITE: NEGATIVE
Protein, ur: NEGATIVE mg/dL
SPECIFIC GRAVITY, URINE: 1.014 (ref 1.005–1.030)
UROBILINOGEN UA: 1 mg/dL (ref 0.0–1.0)
pH: 6.5 (ref 5.0–8.0)

## 2014-09-04 ENCOUNTER — Telehealth: Payer: Self-pay | Admitting: *Deleted

## 2014-09-04 NOTE — Telephone Encounter (Addendum)
I called and left patient a message to call and schedule an appointment for a consultation.  Procedure is coming up soon.  Give me a call back please.  "I'm returning your call.  I called on the twelfth and canceled everything."  I see where they canceled your consultation but I didn't know if you wanted to cancel surgery as well.  "Yes, I told them it was for everything when I called.  The surgical center called me yesterday and I told them.  I'm moving so I don't know when I'll be able to do it."  Okay, give us a call when you want to reschedule.  "I will thank you."  I called [facility] and canceled surgery scheduled for 09/11/2014.

## 2014-09-18 ENCOUNTER — Encounter: Payer: BLUE CROSS/BLUE SHIELD | Admitting: Podiatry

## 2015-04-02 ENCOUNTER — Encounter: Payer: Self-pay | Admitting: Physician Assistant

## 2015-04-02 ENCOUNTER — Ambulatory Visit (INDEPENDENT_AMBULATORY_CARE_PROVIDER_SITE_OTHER): Payer: BLUE CROSS/BLUE SHIELD | Admitting: Physician Assistant

## 2015-04-02 VITALS — BP 118/68 | HR 67 | Temp 98.0°F | Ht 65.0 in | Wt 207.2 lb

## 2015-04-02 DIAGNOSIS — H66009 Acute suppurative otitis media without spontaneous rupture of ear drum, unspecified ear: Secondary | ICD-10-CM | POA: Insufficient documentation

## 2015-04-02 DIAGNOSIS — H66002 Acute suppurative otitis media without spontaneous rupture of ear drum, left ear: Secondary | ICD-10-CM

## 2015-04-02 MED ORDER — AMOXICILLIN 500 MG PO CAPS: 500.0000 mg | ORAL_CAPSULE | Freq: Three times a day (TID) | ORAL | Status: DC

## 2015-04-02 NOTE — Patient Instructions (Signed)
Please take the antibiotic as directed with food. Start Flonase over the counter (use daily).  Symptoms should resolve with medication. Follow-up if not resolving.

## 2015-04-02 NOTE — Progress Notes (Signed)
Pre visit review using our clinic review tool, if applicable. No additional management support is needed unless otherwise documented below in the visit note. 

## 2015-04-02 NOTE — Assessment & Plan Note (Signed)
Cerumen impaction removed via irrigation. Signs of AOM noted on repeat exam. Will begin ABX per orders. Supportive measures reviewed. Begin Flonase. Follow-up PRN.

## 2015-04-02 NOTE — Progress Notes (Signed)
    Patient presents to clinic today c/o L ear pain x 1 week described as stabbing.  Patient endorses nasal congestion and PND. Denies sinus pressure and sinus pain. Denies fever, chills. Denies trauma to the ear. Denies change in hearing. Denies symptoms of R ear.   Past Medical History  Diagnosis Date  . GERD (gastroesophageal reflux disease)     no treatment in 5 years.    No current outpatient prescriptions on file prior to visit.   No current facility-administered medications on file prior to visit.    No Known Allergies  Family History  Problem Relation Age of Onset  . Stroke Mother     "mini strokes"  . Heart disease Father   . Cancer Paternal Grandmother     lung  . Diabetes Neg Hx   . Hypertension Neg Hx     Social History   Social History  . Marital Status: Married    Spouse Name: N/A  . Number of Children: N/A  . Years of Education: N/A   Social History Main Topics  . Smoking status: Never Smoker   . Smokeless tobacco: Never Used  . Alcohol Use: 3.0 oz/week    5 Glasses of wine per week  . Drug Use: No  . Sexual Activity: Not Asked   Other Topics Concern  . 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   Some college   Second marriage- 19 years.       Review of Systems - See HPI.  All other ROS are negative.  BP 118/68 mmHg  Pulse 67  Temp(Src) 98 F (36.7 C) (Oral)  Ht 5\' 5"  (1.651 m)  Wt 207 lb 3.2 oz (93.985 kg)  BMI 34.48 kg/m2  SpO2 98%  Physical Exam  Constitutional: She is oriented to person, place, and time and well-developed, well-nourished, and in no distress.  HENT:  Head: Normocephalic and atraumatic.  Left Ear: Tympanic membrane is erythematous and bulging. A middle ear effusion is present.  L  ear canal obstructed with cerumen impaction -- removed with irrgation  Eyes: Conjunctivae are normal.  Neck: Neck supple.  Cardiovascular: Normal rate, regular rhythm, normal heart sounds and intact distal pulses.   Pulmonary/Chest: Effort normal and breath sounds normal. No respiratory distress. She has no wheezes. She has no rales. She exhibits no tenderness.  Neurological: She is alert and oriented to person, place, and time.  Skin: Skin is warm and dry. No rash noted.  Psychiatric: Affect normal.  Vitals reviewed.   No results found for this or any previous visit (from the past 2160 hour(s)).  Assessment/Plan: Acute purulent otitis media Cerumen impaction removed via irrigation. Signs of AOM noted on repeat exam. Will begin ABX per orders. Supportive measures reviewed. Begin Flonase. Follow-up PRN.

## 2015-05-14 ENCOUNTER — Other Ambulatory Visit: Payer: Self-pay | Admitting: Gynecology

## 2015-05-14 DIAGNOSIS — R928 Other abnormal and inconclusive findings on diagnostic imaging of breast: Secondary | ICD-10-CM

## 2015-05-20 ENCOUNTER — Ambulatory Visit
Admission: RE | Admit: 2015-05-20 | Discharge: 2015-05-20 | Disposition: A | Discharge status: Home / Self Care | Payer: BLUE CROSS/BLUE SHIELD | Source: Ambulatory Visit | Attending: Gynecology | Admitting: Gynecology

## 2015-05-20 DIAGNOSIS — R928 Other abnormal and inconclusive findings on diagnostic imaging of breast: Secondary | ICD-10-CM

## 2015-11-13 SURGERY — Surgical Case
Anesthesia: *Unknown

## 2016-03-23 ENCOUNTER — Telehealth: Payer: Self-pay

## 2016-03-23 NOTE — Telephone Encounter (Signed)
error 

## 2016-03-23 NOTE — Telephone Encounter (Signed)
patient would like a call back from [facility] to cancel MRI and RS.

## 2016-09-16 ENCOUNTER — Encounter (HOSPITAL_BASED_OUTPATIENT_CLINIC_OR_DEPARTMENT_OTHER): Payer: Self-pay

## 2016-09-16 ENCOUNTER — Telehealth: Payer: Self-pay | Admitting: *Deleted

## 2016-09-16 ENCOUNTER — Ambulatory Visit (HOSPITAL_BASED_OUTPATIENT_CLINIC_OR_DEPARTMENT_OTHER)
Admission: RE | Admit: 2016-09-16 | Discharge: 2016-09-16 | Disposition: A | Discharge status: Home / Self Care | Payer: BLUE CROSS/BLUE SHIELD | Source: Ambulatory Visit | Attending: Family | Admitting: Family

## 2016-09-16 ENCOUNTER — Encounter: Payer: Self-pay | Admitting: Family

## 2016-09-16 ENCOUNTER — Ambulatory Visit (INDEPENDENT_AMBULATORY_CARE_PROVIDER_SITE_OTHER): Payer: BLUE CROSS/BLUE SHIELD | Admitting: Family

## 2016-09-16 VITALS — BP 125/59 | HR 75 | Temp 99.1°F | Resp 18 | Ht 65.0 in | Wt 213.8 lb

## 2016-09-16 DIAGNOSIS — R1031 Right lower quadrant pain: Secondary | ICD-10-CM | POA: Diagnosis not present

## 2016-09-16 DIAGNOSIS — N83202 Unspecified ovarian cyst, left side: Secondary | ICD-10-CM | POA: Insufficient documentation

## 2016-09-16 DIAGNOSIS — K760 Fatty (change of) liver, not elsewhere classified: Secondary | ICD-10-CM | POA: Diagnosis not present

## 2016-09-16 DIAGNOSIS — K439 Ventral hernia without obstruction or gangrene: Secondary | ICD-10-CM | POA: Insufficient documentation

## 2016-09-16 LAB — BASIC METABOLIC PANEL
BUN: 14 mg/dL (ref 6–23)
CHLORIDE: 104 meq/L (ref 96–112)
CO2: 28 mEq/L (ref 19–32)
Calcium: 9.3 mg/dL (ref 8.4–10.5)
Creatinine, Ser: 0.68 mg/dL (ref 0.40–1.20)
GFR: 93.52 mL/min (ref >60.00)
GLUCOSE: 108 mg/dL — AB (ref 70–99)
POTASSIUM: 3.9 meq/L (ref 3.5–5.1)
Sodium: 137 mEq/L (ref 135–145)

## 2016-09-16 LAB — CBC WITH DIFFERENTIAL/PLATELET
BASOS ABS: 0 10*3/uL (ref 0.0–0.1)
BASOS PCT: 0.7 % (ref 0.0–3.0)
EOS ABS: 0.1 10*3/uL (ref 0.0–0.7)
Eosinophils Relative: 3.2 % (ref 0.0–5.0)
HEMATOCRIT: 37.3 % (ref 36.0–46.0)
HEMOGLOBIN: 12.6 g/dL (ref 12.0–15.0)
Lymphocytes Relative: 36.4 % (ref 12.0–46.0)
Lymphs Abs: 1.6 10*3/uL (ref 0.7–4.0)
MCHC: 33.7 g/dL (ref 30.0–36.0)
MCV: 93.9 fl (ref 78.0–100.0)
Monocytes Absolute: 0.3 10*3/uL (ref 0.1–1.0)
Monocytes Relative: 7.5 % (ref 3.0–12.0)
Neutro Abs: 2.3 10*3/uL (ref 1.4–7.7)
Neutrophils Relative %: 52.2 % (ref 43.0–77.0)
Platelets: 219 10*3/uL (ref 150.0–400.0)
RBC: 3.97 Mil/uL (ref 3.87–5.11)
RDW: 13.1 % (ref 11.5–15.5)
WBC: 4.4 10*3/uL (ref 4.0–10.5)

## 2016-09-16 LAB — POC URINALSYSI DIPSTICK (AUTOMATED)
Bilirubin, UA: NEGATIVE
GLUCOSE UA: NEGATIVE
Ketones, UA: NEGATIVE
NITRITE UA: NEGATIVE
Protein, UA: NEGATIVE
RBC UA: NEGATIVE
Spec Grav, UA: 1.015 (ref 1.010–1.025)
UROBILINOGEN UA: 0.2 U/dL
pH, UA: 6.5 (ref 5.0–8.0)

## 2016-09-16 MED ORDER — IOPAMIDOL (ISOVUE-300) INJECTION 61%
100.0000 mL | Freq: Once | INTRAVENOUS | Status: AC | PRN
  Administered 2016-09-16: 100 mL via INTRAVENOUS

## 2016-09-16 NOTE — Telephone Encounter (Signed)
Pt called requesting CT results from today. Advised pt it may be tomorrow before we get back with her since PCP is gone for the evening. Please advise?

## 2016-09-16 NOTE — Patient Instructions (Signed)
Please complete blood work prior to leaving. Return this afternoon for CT scan- we will contact you with your results.

## 2016-09-16 NOTE — Progress Notes (Signed)
Subjective:    Patient ID: Jamie Graves, female    DOB: 1955-09-25, 61 y.o.   MRN: [id number]  HPI  Ms. Noviello is a 61 yr old female who presents today with chief complaint of right lower abdominal pain.  Pain has been present x 2 weeks.  Pain began after she moved a grill.    Initially started in her right side, then is "went to my back."  Notes that she did have constipation x 3 days after "i hurt it." but now back to normal bowel pattern. Denies dysuria/frequency, hematuria, or blood in stool. Pain is non-radiating.  + intermittent nausea.   Review of Systems    see HPI  Past Medical History:  Diagnosis Date  . GERD (gastroesophageal reflux disease)    no treatment in 5 years.     Social History   Social History  . Marital status: Married    Spouse name: N/A  . Number of children: N/A  . Years of education: N/A   Occupational History  . Not on file.   Social History Main Topics  . Smoking status: Never Smoker  . Smokeless tobacco: Never Used  . Alcohol use 3.0 oz/week    5 Glasses of wine per week  . Drug use: No  . Sexual activity: Not on file   Other Topics Concern  . 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   Some college   Second marriage- 19 years.        No past surgical history on file.  Family History  Problem Relation Age of Onset  . Stroke Mother        "mini strokes"  . Heart disease Father   . Cancer Paternal Grandmother        lung  . Diabetes Neg Hx   . Hypertension Neg Hx     No Known Allergies  No current outpatient prescriptions on file prior to visit.   No current facility-administered medications on file prior to visit.     BP (!) 125/59 (BP Location: Left Arm, Cuff Size: Large)    Pulse 75   Temp 99.1 F (37.3 C) (Oral)   Resp 18   Ht 5\' 5"  (1.651 m)   Wt 213 lb 12.8 oz (97 kg)   SpO2 98%   BMI 35.58 kg/m    Objective:   Physical Exam  Constitutional: She is oriented to person, place, and time. She appears well-developed and well-nourished.  HENT:  Head: Normocephalic and atraumatic.  Cardiovascular: Normal rate, regular rhythm and normal heart sounds.   No murmur heard. Pulmonary/Chest: Effort normal and breath sounds normal. No respiratory distress. She has no wheezes.  Abdominal: Soft. Bowel sounds are normal. She exhibits no distension. There is no rebound.  + RLQ tenderness to palpation without guarding.   Musculoskeletal: She exhibits no edema.  Neurological: She is alert and oriented to person, place, and time.  Psychiatric: She has a normal mood and affect. Her behavior is normal. Judgment and thought content normal.          Assessment & Plan:  RLQ tenderness- Will obtain CT abd/pelvis to further evaluate.  Differential includes MS pain, diverticulitis, constipation, kidney stone,  appendicitis.  Obtain bmet to assess renal function prior to CT as well as CBC to assess white count.  UA is reviewed and only notes trace leuks.

## 2016-09-17 MED ORDER — CIPROFLOXACIN HCL 500 MG PO TABS: 500.0000 mg | ORAL_TABLET | Freq: Two times a day (BID) | ORAL | 0 refills | Status: DC

## 2016-09-17 NOTE — Telephone Encounter (Signed)
See my chart message

## 2018-11-25 ENCOUNTER — Other Ambulatory Visit: Payer: Self-pay

## 2018-11-29 ENCOUNTER — Ambulatory Visit: Payer: BLUE CROSS/BLUE SHIELD | Admitting: Family

## 2018-11-30 ENCOUNTER — Other Ambulatory Visit: Payer: Self-pay

## 2018-12-01 ENCOUNTER — Ambulatory Visit: Payer: No Typology Code available for payment source | Admitting: Family Medicine

## 2018-12-01 ENCOUNTER — Ambulatory Visit (HOSPITAL_BASED_OUTPATIENT_CLINIC_OR_DEPARTMENT_OTHER)
Admission: RE | Admit: 2018-12-01 | Discharge: 2018-12-01 | Disposition: A | Discharge status: Home / Self Care | Payer: No Typology Code available for payment source | Source: Ambulatory Visit | Attending: Family Medicine | Admitting: Family Medicine

## 2018-12-01 ENCOUNTER — Encounter: Payer: Self-pay | Admitting: Family Medicine

## 2018-12-01 VITALS — BP 131/73 | HR 73 | Temp 97.9°F | Resp 18 | Ht 65.0 in | Wt 228.6 lb

## 2018-12-01 DIAGNOSIS — R319 Hematuria, unspecified: Secondary | ICD-10-CM | POA: Diagnosis not present

## 2018-12-01 DIAGNOSIS — R1030 Lower abdominal pain, unspecified: Secondary | ICD-10-CM

## 2018-12-01 LAB — CBC WITH DIFFERENTIAL/PLATELET
Basophils Absolute: 0 10*3/uL (ref 0.0–0.1)
Basophils Relative: 0.7 % (ref 0.0–3.0)
Eosinophils Absolute: 0.2 10*3/uL (ref 0.0–0.7)
Eosinophils Relative: 2.7 % (ref 0.0–5.0)
HCT: 39.3 % (ref 36.0–46.0)
Hemoglobin: 13.1 g/dL (ref 12.0–15.0)
Lymphocytes Relative: 33.1 % (ref 12.0–46.0)
Lymphs Abs: 1.8 10*3/uL (ref 0.7–4.0)
MCHC: 33.3 g/dL (ref 30.0–36.0)
MCV: 95.9 fl (ref 78.0–100.0)
Monocytes Absolute: 0.3 10*3/uL (ref 0.1–1.0)
Monocytes Relative: 6.1 % (ref 3.0–12.0)
Neutro Abs: 3.2 10*3/uL (ref 1.4–7.7)
Neutrophils Relative %: 57.4 % (ref 43.0–77.0)
Platelets: 246 10*3/uL (ref 150.0–400.0)
RBC: 4.1 Mil/uL (ref 3.87–5.11)
RDW: 13.6 % (ref 11.5–15.5)
WBC: 5.6 10*3/uL (ref 4.0–10.5)

## 2018-12-01 LAB — COMPREHENSIVE METABOLIC PANEL
ALT: 22 U/L (ref 0–35)
AST: 21 U/L (ref 0–37)
Albumin: 4.3 g/dL (ref 3.5–5.2)
Alkaline Phosphatase: 70 U/L (ref 39–117)
BUN: 23 mg/dL (ref 6–23)
CO2: 29 mEq/L (ref 19–32)
Calcium: 9.2 mg/dL (ref 8.4–10.5)
Chloride: 105 mEq/L (ref 96–112)
Creatinine, Ser: 0.59 mg/dL (ref 0.40–1.20)
GFR: 102.9 mL/min (ref >60.00)
Glucose, Bld: 108 mg/dL — ABNORMAL HIGH (ref 70–99)
Potassium: 4.8 mEq/L (ref 3.5–5.1)
Sodium: 140 mEq/L (ref 135–145)
Total Bilirubin: 0.5 mg/dL (ref 0.2–1.2)
Total Protein: 6.5 g/dL (ref 6.0–8.3)

## 2018-12-01 LAB — POCT URINALYSIS DIP (MANUAL ENTRY)
Bilirubin, UA: NEGATIVE
Glucose, UA: NEGATIVE mg/dL
Ketones, POC UA: NEGATIVE mg/dL
Nitrite, UA: NEGATIVE
Protein Ur, POC: NEGATIVE mg/dL
Spec Grav, UA: 1.025 (ref 1.010–1.025)
Urobilinogen, UA: 0.2 E.U./dL
pH, UA: 6 (ref 5.0–8.0)

## 2018-12-01 LAB — AMYLASE: Amylase: 69 U/L (ref 27–131)

## 2018-12-01 LAB — LIPASE: Lipase: 6 U/L — ABNORMAL LOW (ref 11.0–59.0)

## 2018-12-01 LAB — H. PYLORI ANTIBODY, IGG: H Pylori IgG: NEGATIVE

## 2018-12-01 MED ORDER — NITROFURANTOIN MONOHYD MACRO 100 MG PO CAPS: 100.0000 mg | ORAL_CAPSULE | Freq: Two times a day (BID) | ORAL | 0 refills | Status: AC

## 2018-12-01 NOTE — Patient Instructions (Signed)
Urinary Tract Infection, Adult A urinary tract infection (UTI) is an infection of any part of the urinary tract. The urinary tract includes:  The kidneys.  The ureters.  The bladder.  The urethra. These organs make, store, and get rid of pee (urine) in the body. What are the causes? This is caused by germs (bacteria) in your genital area. These germs grow and cause swelling (inflammation) of your urinary tract. What increases the risk? You are more likely to develop this condition if:  You have a small, thin tube (catheter) to drain pee.  You cannot control when you pee or poop (incontinence).  You are female, and: ? You use these methods to prevent pregnancy: ? A medicine that kills sperm (spermicide). ? A device that blocks sperm (diaphragm). ? You have low levels of a female hormone (estrogen). ? You are pregnant.  You have genes that add to your risk.  You are sexually active.  You take antibiotic medicines.  You have trouble peeing because of: ? A prostate that is bigger than normal, if you are female. ? A blockage in the part of your body that drains pee from the bladder (urethra). ? A kidney stone. ? A nerve condition that affects your bladder (neurogenic bladder). ? Not getting enough to drink. ? Not peeing often enough.  You have other conditions, such as: ? Diabetes. ? A weak disease-fighting system (immune system). ? Sickle cell disease. ? Gout. ? Injury of the spine. What are the signs or symptoms? Symptoms of this condition include:  Needing to pee right away (urgently).  Peeing often.  Peeing small amounts often.  Pain or burning when peeing.  Blood in the pee.  Pee that smells bad or not like normal.  Trouble peeing.  Pee that is cloudy.  Fluid coming from the vagina, if you are female.  Pain in the belly or lower back. Other symptoms include:  Throwing up (vomiting).  No urge to eat.  Feeling mixed up (confused).  Being tired  and grouchy (irritable).  A fever.  Watery poop (diarrhea). How is this treated? This condition may be treated with:  Antibiotic medicine.  Other medicines.  Drinking enough water. Follow these instructions at home:  Medicines  Take over-the-counter and prescription medicines only as told by your doctor.  If you were prescribed an antibiotic medicine, take it as told by your doctor. Do not stop taking it even if you start to feel better. General instructions  Make sure you: ? Pee until your bladder is empty. ? Do not hold pee for a long time. ? Empty your bladder after sex. ? Wipe from front to back after pooping if you are a female. Use each tissue one time when you wipe.  Drink enough fluid to keep your pee pale yellow.  Keep all follow-up visits as told by your doctor. This is important. Contact a doctor if:  You do not get better after 1-2 days.  Your symptoms go away and then come back. Get help right away if:  You have very bad back pain.  You have very bad pain in your lower belly.  You have a fever.  You are sick to your stomach (nauseous).  You are throwing up. Summary  A urinary tract infection (UTI) is an infection of any part of the urinary tract.  This condition is caused by germs in your genital area.  There are many risk factors for a UTI. These include having a small, thin   tube to drain pee and not being able to control when you pee or poop.  Treatment includes antibiotic medicines for germs.  Drink enough fluid to keep your pee pale yellow. This information is not intended to replace advice given to you by your health care provider. Make sure you discuss any questions you have with your health care provider. Document Released: 09/23/2007 Document Revised: 03/24/2018 Document Reviewed: 10/14/2017 Elsevier Patient Education  2020 Elsevier Inc[employer]  

## 2018-12-01 NOTE — Progress Notes (Signed)
Patient ID: Jamie Graves, female    DOB: 25-Jan-1956  Age: 63 y.o. MRN: [id number]    Subjective:  Subjective  HPI Jamie Graves presents for ruq pain since last week , eating makes it worse Pt also c/o c/o hematuria   Review of Systems  Constitutional: Negative for appetite change, diaphoresis, fatigue and unexpected weight change.  Eyes: Negative for pain, redness and visual disturbance.  Respiratory: Negative for cough, chest tightness, shortness of breath and wheezing.   Cardiovascular: Positive for chest pain. Negative for palpitations and leg swelling.  Gastrointestinal: Positive for abdominal pain, constipation and nausea. Negative for blood in stool, diarrhea and vomiting.  Endocrine: Negative for cold intolerance, heat intolerance, polydipsia, polyphagia and polyuria.  Genitourinary: Positive for difficulty urinating, flank pain and hematuria. Negative for dysuria and frequency.  Neurological: Negative for dizziness, light-headedness, numbness and headaches.    History Past Medical History:  Diagnosis Date  . GERD (gastroesophageal reflux disease)    no treatment in 5 years.    She has no past surgical history on file.   Her family history includes Cancer in her paternal grandmother; Heart disease in her father; Stroke in her mother.She reports that she has never smoked. She has never used smokeless tobacco. She reports current alcohol use of about 5.0 standard drinks of alcohol per week. She reports that she does not use drugs.  No current outpatient medications on file prior to visit.   No current facility-administered medications on file prior to visit.      Objective:  Objective  Physical Exam Constitutional:      Appearance: She is well-developed.  HENT:     Head: Normocephalic and atraumatic.  Eyes:     Conjunctiva/sclera: Conjunctivae normal.  Neck:     Musculoskeletal: Normal range of motion and neck supple.     Thyroid: No thyromegaly.     Vascular:  No carotid bruit or JVD.  Cardiovascular:     Rate and Rhythm: Normal rate and regular rhythm.     Heart sounds: Normal heart sounds. No murmur.  Pulmonary:     Effort: Pulmonary effort is normal. No respiratory distress.     Breath sounds: Normal breath sounds. No wheezing or rales.  Chest:     Chest wall: No tenderness.  Neurological:     Mental Status: She is alert and oriented to person, place, and time.    BP 131/73 (BP Location: Left Arm, Patient Position: Sitting, Cuff Size: Normal)   Pulse 73   Temp 97.9 F (36.6 C) (Temporal)   Resp 18   Ht 5\' 5"  (1.651 m)   Wt 228 lb 9.6 oz (103.7 kg)   SpO2 98%   BMI 38.04 kg/m  Wt Readings from Last 3 Encounters:  12/01/18 228 lb 9.6 oz (103.7 kg)  09/16/16 213 lb 12.8 oz (97 kg)  04/02/15 207 lb 3.2 oz (94 kg)     Lab Results  Component Value Date   WBC 5.6 12/01/2018   HGB 13.1 12/01/2018   HCT 39.3 12/01/2018   PLT 246.0 12/01/2018   GLUCOSE 108 (H) 12/01/2018   CHOL 212 (H) 07/30/2014   TRIG 79.0 07/30/2014   HDL 53.00 07/30/2014   LDLCALC 143 (H) 07/30/2014   ALT 22 12/01/2018   AST 21 12/01/2018   NA 140 12/01/2018   K 4.8 12/01/2018   CL 105 12/01/2018   CREATININE 0.59 12/01/2018   BUN 23 12/01/2018   CO2 29 12/01/2018   TSH 1.26 05/28/2014  Ct Abdomen Pelvis W Contrast  Result Date: 09/16/2016 CLINICAL DATA:  Right lower abdominal pain for 2 weeks EXAM: CT ABDOMEN AND PELVIS WITH CONTRAST TECHNIQUE: Multidetector CT imaging of the abdomen and pelvis was performed using the standard protocol following bolus administration of intravenous contrast. Oral contrast was also administered. CONTRAST:  100mL ISOVUE-300 IOPAMIDOL (ISOVUE-300) INJECTION 61% COMPARISON:  Abdomen series December 08, 2012 FINDINGS: Lower chest: Lung bases are clear except for slight atelectasis in the inferior lingula. Hepatobiliary: There is hepatic steatosis. There is a tiny cyst in the dome of the liver on the right anteriorly. No other  liver lesions are evident. Gallbladder wall is not appreciably thickened. There is no biliary duct dilatation. Pancreas: No pancreatic mass or inflammatory focus. Spleen: No splenic lesions are evident. Adrenals/Urinary Tract: Adrenals appear normal bilaterally. Kidneys bilaterally show no mass or hydronephrosis on either side. There is no renal or ureteral calculus on either side. Urinary bladder is midline with wall thickness within normal limits. Stomach/Bowel: There is no appreciable bowel wall or mesenteric thickening. There is no evident bowel obstruction. No free air or portal venous air. Vascular/Lymphatic: There is no abdominal aortic aneurysm. No vascular lesions are appreciable. There is no adenopathy in the abdomen or pelvis. Reproductive: Uterus is anteverted. There is a 1.8 x 1.7 cm left ovarian cyst. No other pelvic mass evident. Other: Appendix appears normal. There is no evident ascites or abscess in the abdomen or pelvis. There is a small ventral hernia containing only fat. Musculoskeletal: There is vacuum phenomenon at L4-5 and L5-S1 with severe disc space narrowing at L5-S1. There are no blastic or lytic bone lesions. No intramuscular or abdominal wall lesion. IMPRESSION: Appendix appears normal.  No bowel obstruction.  No abscess. Left ovarian region cyst measuring 1.8 x 1.7 cm. In this age group, a follow-up ultrasound in 1 year to confirm stability is warranted per consensus guidelines. Hepatic steatosis. No renal or ureteral calculus.  No hydronephrosis. Small ventral hernia containing only fat. Electronically Signed   By: Bretta BangWilliam  Woodruff III M.D.   On: 09/16/2016 14:30     Assessment & Plan:  Plan  I have discontinued Jamie Graves's ciprofloxacin. I am also having her start on nitrofurantoin (macrocrystal-monohydrate).  Meds ordered this encounter  Medications  . nitrofurantoin, macrocrystal-monohydrate, (MACROBID) 100 MG capsule    Sig: Take 1 capsule (100 mg total) by mouth 2  (two) times daily.    Dispense:  14 capsule    Refill:  0    Problem List Items Addressed This Visit    None    Visit Diagnoses    Hematuria, unspecified type    -  Primary   Relevant Medications   nitrofurantoin, macrocrystal-monohydrate, (MACROBID) 100 MG capsule   Other Relevant Orders   POCT urinalysis dipstick (Completed)   Urine Culture   CBC with Differential/Platelet (Completed)   Comprehensive metabolic panel (Completed)   H. pylori antibody, IgG (Completed)   Amylase (Completed)   Lipase (Completed)   CT RENAL STONE STUDY (Completed)   Lower abdominal pain       Relevant Orders   CBC with Differential/Platelet (Completed)   Comprehensive metabolic panel (Completed)   H. pylori antibody, IgG (Completed)   Amylase (Completed)   Lipase (Completed)      Strain urine    Check ct   Follow-up: Return if symptoms worsen or fail to improve.  Donato SchultzYvonne R Lowne Chase, DO

## 2018-12-03 LAB — URINE CULTURE
MICRO NUMBER:: 768377
SPECIMEN QUALITY:: ADEQUATE

## 2020-10-14 IMAGING — CT CT RENAL STONE PROTOCOL
2 of 4 series · 17 of 46 positions shown, 19 images · non-contrast
Comparison: 09/16/2016

CLINICAL DATA: Hematuria and right-sided abdominal pain

EXAM:
CT ABDOMEN AND PELVIS WITHOUT CONTRAST
TECHNIQUE: Multidetector CT imaging of the abdomen and pelvis was performed
following the standard protocol without IV contrast.

[Series 2: axial st · axial · 0.98mm/px · z∈[-491,-51]mm · 14 of 96 slices shown, 16 images]
[im 4/96  soft-tissue]
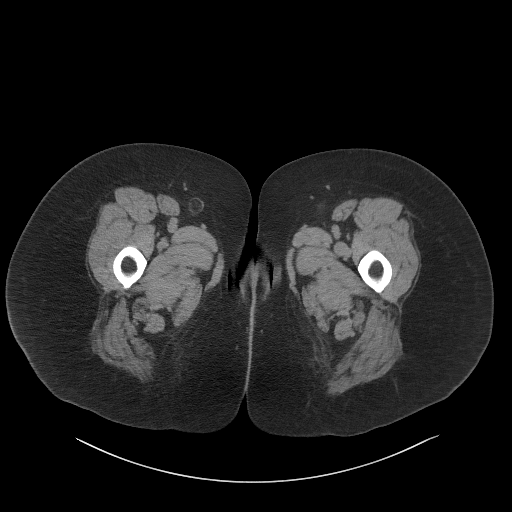
[im 4/96  bone]
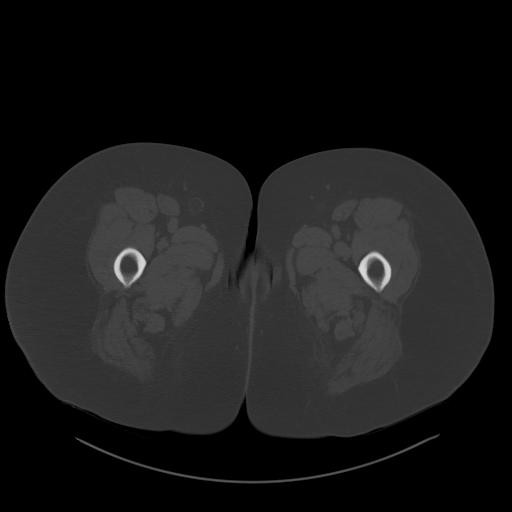
[im 12/96  soft-tissue]
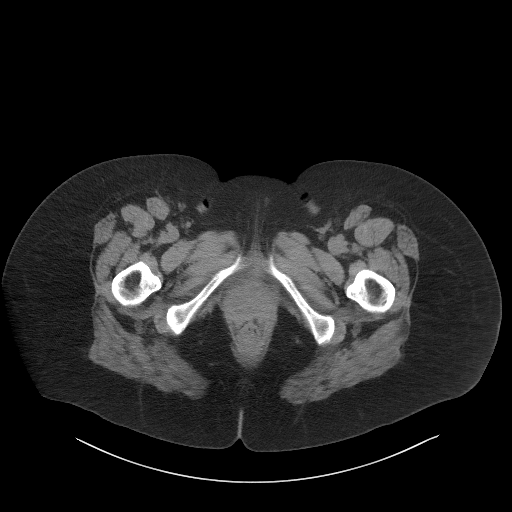
[im 20/96  soft-tissue]
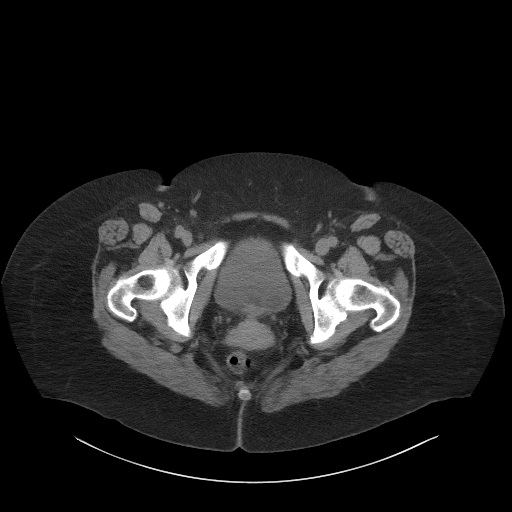
[im 24/96  soft-tissue]
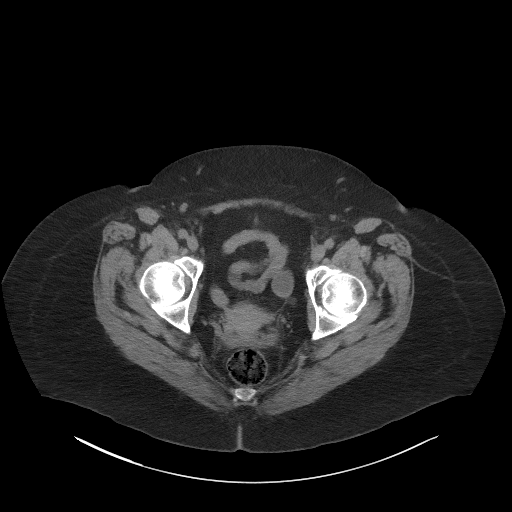
[im 32/96  soft-tissue]
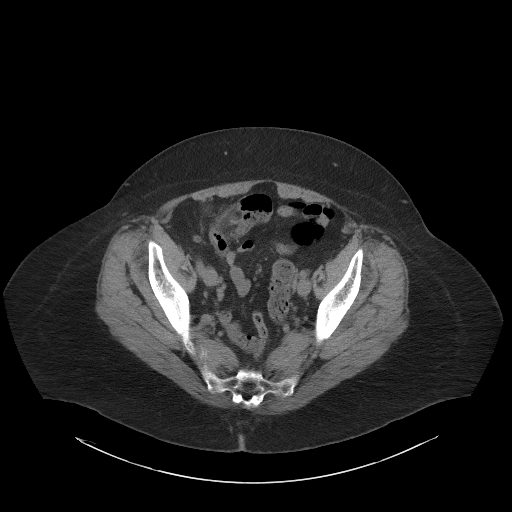
[im 40/96  soft-tissue]
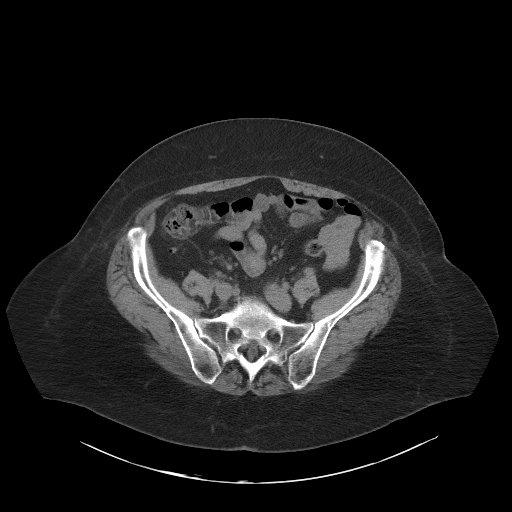
[im 44/96  soft-tissue]
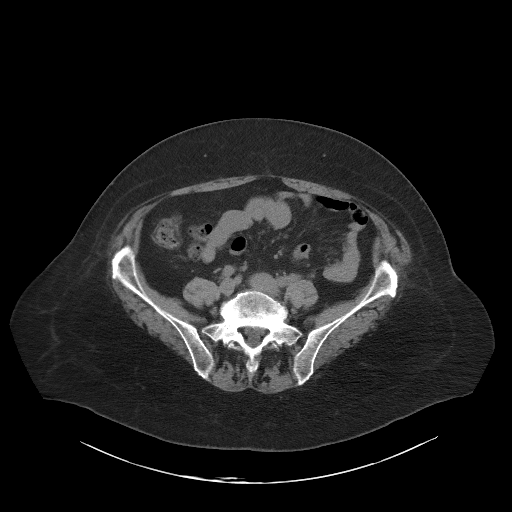
[im 52/96  soft-tissue]
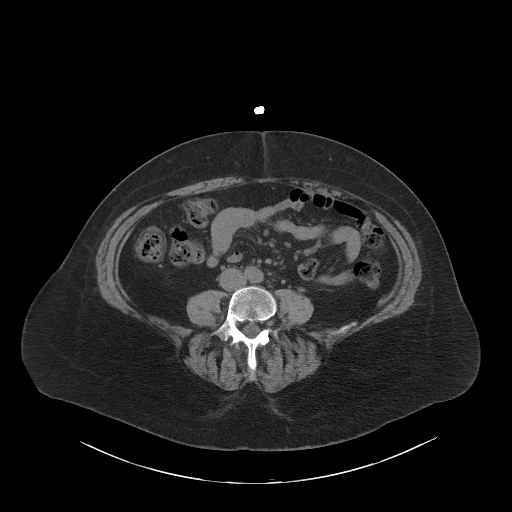
[im 56/96  soft-tissue]
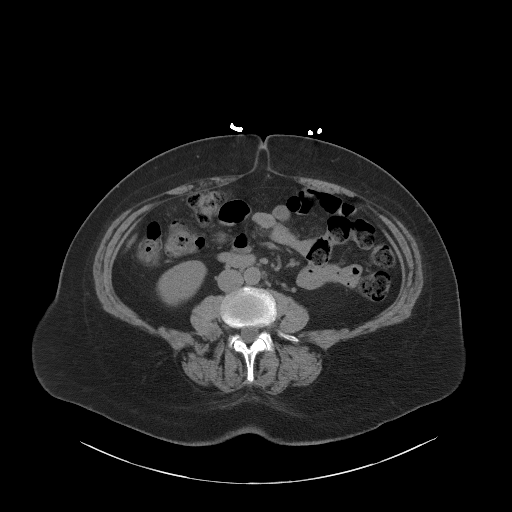
[im 56/96  bone]
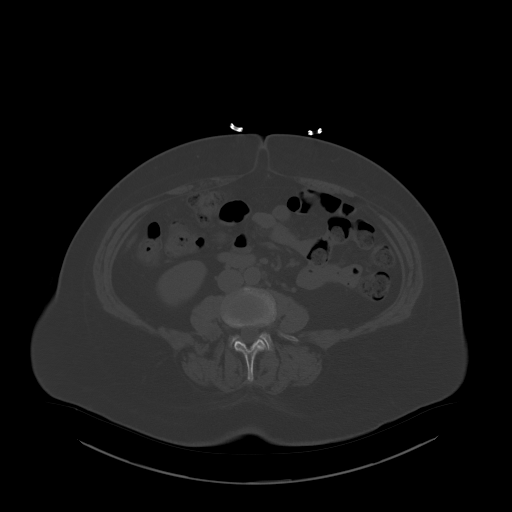
[im 64/96  soft-tissue]
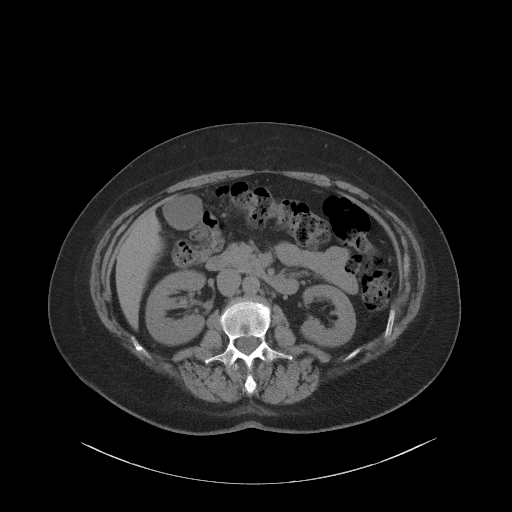
[im 72/96  soft-tissue]
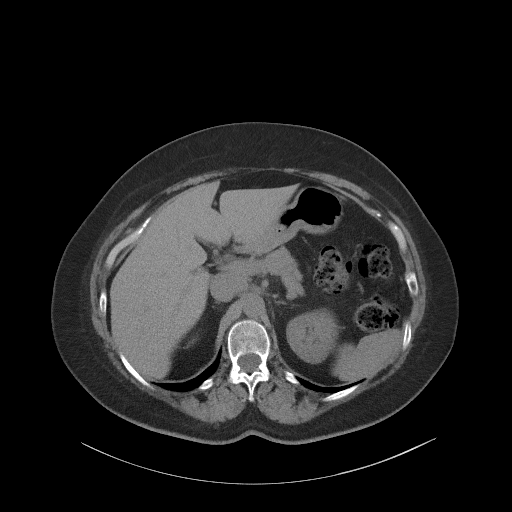
[im 76/96  soft-tissue]
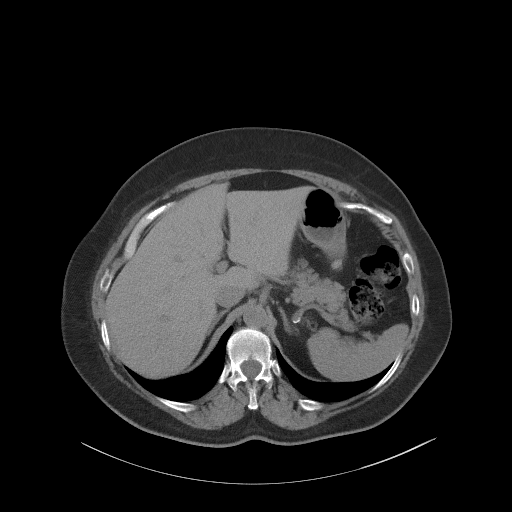
[im 84/96  soft-tissue]
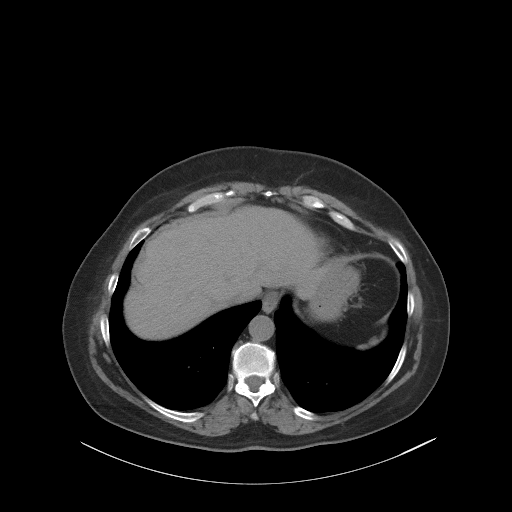
[im 92/96  soft-tissue]
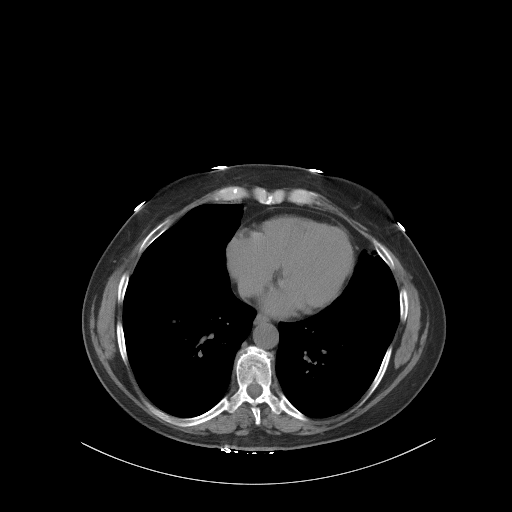

[Series 4: coronal st · coronal · 0.99mm/px · 3 of 111 slices shown]
[im 37/111  soft-tissue]
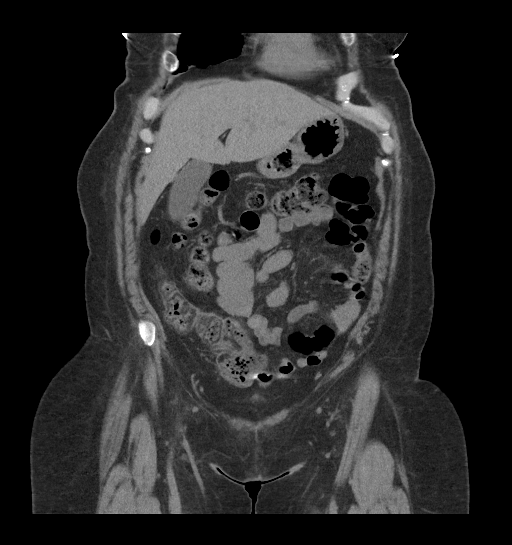
[im 49/111  soft-tissue]
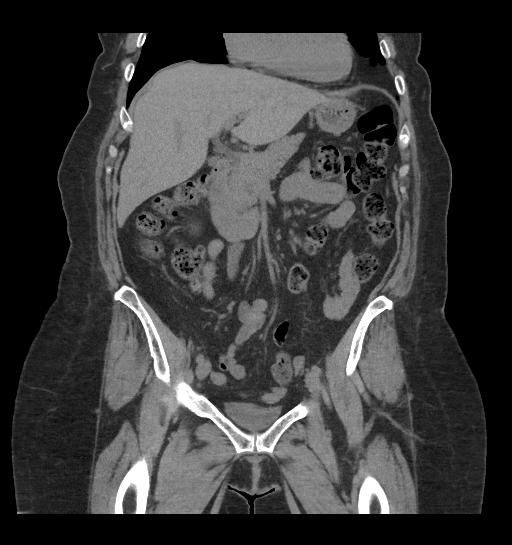
[im 62/111  soft-tissue]
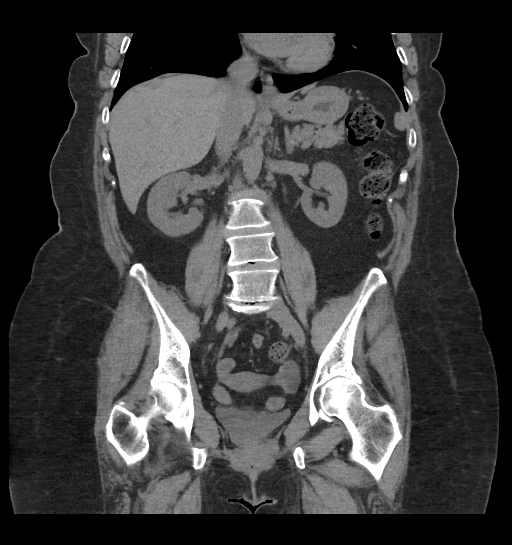

[17 of 46 positions shown; findings below may reference images not displayed]

FINDINGS: Lower chest: No acute abnormality.

Hepatobiliary: No focal liver abnormality is seen. No gallstones,
gallbladder wall thickening, or biliary dilatation.

Pancreas: Unremarkable. No pancreatic ductal dilatation or
surrounding inflammatory changes.

Spleen: Normal in size without focal abnormality.

Adrenals/Urinary Tract: Adrenal glands are within normal limits
bilaterally. No renal calculi or obstructive changes are seen. The
bladder is decompressed.

Stomach/Bowel: Appendix is within normal limits. No obstructive or
inflammatory changes of large or small bowel are seen. The stomach
is within normal limits.

Vascular/Lymphatic: No significant vascular findings are present. No
enlarged abdominal or pelvic lymph nodes.

Reproductive: Uterus and bilateral adnexa are unremarkable.

Other: No abdominal wall hernia or abnormality. No abdominopelvic
ascites.

Musculoskeletal: No acute or significant osseous findings.
IMPRESSION: No renal calculi or obstructive changes are identified. No acute
abnormality is noted.
